# Patient Record
Sex: Male | Born: 1990 | Race: Black or African American | Hispanic: No | Marital: Single | Smoking: Never smoker
Health system: Southern US, Community
[De-identification: ages and names within clinical notes are randomized; demographics above are authoritative.]

## PROBLEM LIST (undated history)

## (undated) DIAGNOSIS — R011 Cardiac murmur, unspecified: Secondary | ICD-10-CM

## (undated) HISTORY — PX: ANKLE SURGERY: SHX546

---
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
## 2012-10-09 ENCOUNTER — Encounter (HOSPITAL_COMMUNITY): Payer: Self-pay | Admitting: *Deleted

## 2012-10-09 ENCOUNTER — Emergency Department (HOSPITAL_COMMUNITY)
Admission: EM | Admit: 2012-10-09 | Discharge: 2012-10-09 | Disposition: A | Discharge status: Home / Self Care | Payer: MEDICAID | Attending: Emergency Medicine | Admitting: Emergency Medicine

## 2012-10-09 DIAGNOSIS — F17200 Nicotine dependence, unspecified, uncomplicated: Secondary | ICD-10-CM | POA: Insufficient documentation

## 2012-10-09 DIAGNOSIS — F1210 Cannabis abuse, uncomplicated: Secondary | ICD-10-CM | POA: Insufficient documentation

## 2012-10-09 DIAGNOSIS — F1290 Cannabis use, unspecified, uncomplicated: Secondary | ICD-10-CM

## 2012-10-09 DIAGNOSIS — R0789 Other chest pain: Secondary | ICD-10-CM | POA: Insufficient documentation

## 2012-10-09 DIAGNOSIS — R011 Cardiac murmur, unspecified: Secondary | ICD-10-CM | POA: Insufficient documentation

## 2012-10-09 HISTORY — DX: Cardiac murmur, unspecified: R01.1

## 2012-10-09 NOTE — ED Notes (Signed)
Recalled patient to triage room for assessment. Patient states "I feel better. I don't feel like my heart is racing."

## 2012-10-09 NOTE — ED Provider Notes (Signed)
History     CSN: [id number]  Arrival date & time 10/09/12  1757   First MD Initiated Contact with Patient 10/09/12 1937      Chief Complaint  Patient presents with  . Tachycardia    (Consider location/radiation/quality/duration/timing/severity/associated sxs/prior treatment) HPI Comments: 22 yo male presents to ED with his mother and requests evaluation of a rapid heat beat after he smoked some marijuana.  States he is unsure if the the marijuana was "laced" with any thing.  He states the sx's had improved prior to ED arrival.  He denies chest pain , shortness of breath or palpations at this time.  He also denies headache, visual changes or dizziness.    The history is provided by the patient and a parent.    Past Medical History  Diagnosis Date  . Murmur, heart     Past Surgical History  Procedure Laterality Date  . Ankle surgery      History reviewed. No pertinent family history.  History  Substance Use Topics  . Smoking status: Current Every Day Smoker  . Smokeless tobacco: Not on file  . Alcohol Use: No      Review of Systems  Constitutional: Negative for fever, chills and fatigue.  HENT: Negative for sore throat, trouble swallowing, neck pain and neck stiffness.   Respiratory: Positive for chest tightness. Negative for cough, shortness of breath and wheezing.   Cardiovascular: Negative for chest pain and palpitations.       Rapid heart beat  Gastrointestinal: Negative for nausea, vomiting, abdominal pain and blood in stool.  Genitourinary: Negative for dysuria, hematuria and flank pain.  Musculoskeletal: Negative for myalgias, back pain and arthralgias.  Skin: Negative for rash.  Neurological: Negative for dizziness, weakness and numbness.  Hematological: Does not bruise/bleed easily.  All other systems reviewed and are negative.    Allergies  Review of patient's allergies indicates no known allergies.  Home Medications  No current outpatient  prescriptions on file.  BP 139/57  Pulse 74  Temp(Src) 98.2 F (36.8 C) (Oral)  Resp 18  Ht 6\' 4"  (1.93 m)  Wt 160 lb (72.576 kg)  BMI 19.48 kg/m2  SpO2 100%  Physical Exam  Nursing note and vitals reviewed. Constitutional: He is oriented to person, place, and time. He appears well-developed and well-nourished. No distress.  HENT:  Head: Normocephalic and atraumatic.  Mouth/Throat: Oropharynx is clear and moist.  Eyes: Conjunctivae and EOM are normal. Pupils are equal, round, and reactive to light.  Neck: Normal range of motion. Neck supple.  Cardiovascular: Normal rate, regular rhythm, normal heart sounds and intact distal pulses.   No murmur heard. Pulmonary/Chest: Effort normal and breath sounds normal. No respiratory distress. He exhibits no tenderness.  Abdominal: Soft. He exhibits no distension and no mass. There is no tenderness. There is no rebound and no guarding.  Musculoskeletal: Normal range of motion. He exhibits no tenderness.  Lymphadenopathy:    He has no cervical adenopathy.  Neurological: He is alert and oriented to person, place, and time. He exhibits normal muscle tone. Coordination normal.  Skin: Skin is warm and dry.    ED Course  Procedures (including critical care time)  Labs Reviewed - No data to display No results found.      MDM     Date: 10/09/2012  Rate: 112  Rhythm: sinus tachycardia  QRS Axis: normal  Intervals: normal  ST/T Wave abnormalities: normal  Conduction Disutrbances:none  Narrative Interpretation:   Old EKG Reviewed: none  available    EKG read by Dr. Effie Shy   On recheck of vital signs, heart rate has improved, pt denies any sx's at this time.  Appears stable for discharge. sx's likely reaction from marijuana use.  Mother agrees to f/u with his PMD  The patient appears reasonably screened and/or stabilized for discharge and I doubt any other medical condition or other [facility] requiring further screening, evaluation, or  treatment in the ED at this time prior to discharge.      Maison Agrusa L. Trisha Mangle, PA-C 10/12/12 2246

## 2012-10-09 NOTE — ED Notes (Signed)
Pt presents to er today with c/o fast heartbeat after smoking a joint. Denies any pain,

## 2012-10-13 NOTE — ED Provider Notes (Signed)
Medical screening examination/treatment/procedure(s) were performed by non-physician practitioner and as supervising physician I was immediately available for consultation/collaboration.   Teea Ducey M Arelys Glassco, DO 10/13/12 1349 

## 2012-10-18 ENCOUNTER — Encounter (HOSPITAL_COMMUNITY): Payer: Self-pay

## 2012-10-18 ENCOUNTER — Emergency Department (HOSPITAL_COMMUNITY)
Admission: EM | Admit: 2012-10-18 | Discharge: 2012-10-19 | Disposition: A | Discharge status: Home / Self Care | Payer: Medicaid Other | Attending: Emergency Medicine | Admitting: Emergency Medicine

## 2012-10-18 DIAGNOSIS — R5383 Other fatigue: Secondary | ICD-10-CM

## 2012-10-18 DIAGNOSIS — R0902 Hypoxemia: Secondary | ICD-10-CM

## 2012-10-18 DIAGNOSIS — R5381 Other malaise: Secondary | ICD-10-CM | POA: Insufficient documentation

## 2012-10-18 DIAGNOSIS — I498 Other specified cardiac arrhythmias: Secondary | ICD-10-CM | POA: Insufficient documentation

## 2012-10-18 DIAGNOSIS — F1210 Cannabis abuse, uncomplicated: Secondary | ICD-10-CM | POA: Insufficient documentation

## 2012-10-18 DIAGNOSIS — R112 Nausea with vomiting, unspecified: Secondary | ICD-10-CM | POA: Insufficient documentation

## 2012-10-18 DIAGNOSIS — R011 Cardiac murmur, unspecified: Secondary | ICD-10-CM | POA: Insufficient documentation

## 2012-10-18 DIAGNOSIS — H538 Other visual disturbances: Secondary | ICD-10-CM | POA: Insufficient documentation

## 2012-10-18 DIAGNOSIS — F17200 Nicotine dependence, unspecified, uncomplicated: Secondary | ICD-10-CM | POA: Insufficient documentation

## 2012-10-18 LAB — RAPID URINE DRUG SCREEN, HOSP PERFORMED
Barbiturates: NOT DETECTED
Cocaine: NOT DETECTED
Opiates: NOT DETECTED

## 2012-10-18 LAB — COMPREHENSIVE METABOLIC PANEL
ALT: 18 U/L (ref 0–53)
AST: 18 U/L (ref 0–37)
Albumin: 4 g/dL (ref 3.5–5.2)
Alkaline Phosphatase: 72 U/L (ref 39–117)
Calcium: 9.3 mg/dL (ref 8.4–10.5)
Creatinine, Ser: 1 mg/dL (ref 0.50–1.35)
Glucose, Bld: 101 mg/dL — ABNORMAL HIGH (ref 70–99)
Total Protein: 7.3 g/dL (ref 6.0–8.3)

## 2012-10-18 LAB — CBC WITH DIFFERENTIAL/PLATELET
Basophils Relative: 0 % (ref 0–1)
Eosinophils Absolute: 0.3 10*3/uL (ref 0.0–0.7)
Eosinophils Relative: 4 % (ref 0–5)
Monocytes Absolute: 0.5 10*3/uL (ref 0.1–1.0)
Monocytes Relative: 6 % (ref 3–12)
Neutro Abs: 3.1 10*3/uL (ref 1.7–7.7)
Neutrophils Relative %: 44 % (ref 43–77)
Platelets: 287 10*3/uL (ref 150–400)
RDW: 14.3 % (ref 11.5–15.5)
WBC: 7.2 10*3/uL (ref 4.0–10.5)

## 2012-10-18 NOTE — ED Notes (Signed)
Not feeling like himself. Feeling tired, nauseated, lightheaded per mother.

## 2012-10-18 NOTE — ED Provider Notes (Signed)
History    This chart was scribed for Ward Givens, MD by Marlyne Beards, ED Scribe. The patient was seen in room APA10/APA10. Patient's care was started at 11:15 PM.    CSN: [id number]  Arrival date & time 10/18/12  2214   First MD Initiated Contact with Patient 10/18/12 2305      Chief Complaint  Patient presents with  . Nausea  . Dizziness    (Consider location/radiation/quality/duration/timing/severity/associated sxs/prior treatment) The history is provided by the patient. No language interpreter was used.   Adam Malone is a 22 y.o. male who presents to the Emergency Department complaining of moderate constant nausea and lightheadedness onset earlier this evening around 10 PM. Pt called his mother stating that he did not feel like himself. Pt had just got done walking about 6 minutes when sx's began. Pt denies any pain associated with the sx's currently but did have an  sharp pain in the top of his  head earlier in the evening for a couple of seconds that alleviated right after. Pt states that he felt nauseated upon arrival to the ED but that has resolved. Pt reports he feels fine as of now in the ED but does notice that he has some abdominal gas build up.  Patient states it's hard to describe how he felt. He denies any pain. He states he felt tired. He did not have nausea until he arrived in emergency department. He denies vomiting or diarrhea. He denies abdominal pain. He denies any headache. He denied sore throat, rhinorrhea, or coughing. He states he did have some blurred vision earlier but not now. He states he just feels tired. He states he's never felt this way before. He denies being around anybody else who is sick.    Mother states that pt had smoked some marijuana previously with a new person resulting in making his heart race. He was seen in the ED for sx's but was discharged due to everything being normal. Pt denies fever, chills, cough, diarrhea, SOB, weakness, and any  other associated symptoms.    Pt's PCP is Dr. Milford Cage   Past Medical History  Diagnosis Date  . Murmur, heart     Past Surgical History  Procedure Laterality Date  . Ankle surgery      No family history on file.  History  Substance Use Topics  . Smoking status: Current Every Day Smoker  . Smokeless tobacco: Not on file  . Alcohol Use: No  unemployed Not in school Lives with mother    Review of Systems  Constitutional: Negative for fatigue.  HENT: Negative for congestion, sinus pressure and ear discharge.   Eyes: Negative for discharge.  Respiratory: Negative for cough.   Cardiovascular: Negative for chest pain.  Gastrointestinal: Positive for nausea and vomiting. Negative for abdominal pain and diarrhea.  Genitourinary: Negative for frequency and hematuria.  Musculoskeletal: Negative for back pain.  Skin: Negative for rash.  Neurological: Positive for light-headedness. Negative for seizures and headaches.  Psychiatric/Behavioral: Negative for hallucinations.    Allergies  Review of patient's allergies indicates no known allergies.  Home Medications   none  BP 140/70  Pulse 64  Temp(Src) 97.5 F (36.4 C) (Oral)  Resp 18  Ht 6\' 3"  (1.905 m)  Wt 170 lb (77.111 kg)  BMI 21.25 kg/m2  SpO2 100%  Vital signs normal    Orthostatic VS normal but had baseline bradycardia  Physical Exam  Nursing note and vitals reviewed. Constitutional: He is oriented  to person, place, and time. He appears well-developed and well-nourished.  Non-toxic appearance. He does not appear ill. No distress.  HENT:  Head: Normocephalic and atraumatic.  Right Ear: External ear normal.  Left Ear: External ear normal.  Nose: Nose normal. No mucosal edema or rhinorrhea.  Mouth/Throat: Oropharynx is clear and moist and mucous membranes are normal. No dental abscesses or edematous.  Eyes: Conjunctivae and EOM are normal. Pupils are equal, round, and reactive to light.  Neck: Normal range  of motion and full passive range of motion without pain. Neck supple.  Cardiovascular: Normal rate, regular rhythm and normal heart sounds.  Exam reveals no gallop and no friction rub.   No murmur heard. Pulmonary/Chest: Effort normal and breath sounds normal. No respiratory distress. He has no wheezes. He has no rhonchi. He has no rales. He exhibits no tenderness and no crepitus.  Abdominal: Soft. Normal appearance and bowel sounds are normal. He exhibits no distension. There is no tenderness. There is no rebound and no guarding.  Musculoskeletal: Normal range of motion. He exhibits no edema and no tenderness.  Moves all extremities well.   Neurological: He is alert and oriented to person, place, and time. He has normal strength. No cranial nerve deficit.  Skin: Skin is warm, dry and intact. No rash noted. No erythema. No pallor.  Psychiatric: He has a normal mood and affect. His speech is normal and behavior is normal. His mood appears not anxious.    ED Course  Procedures (including critical care time) DIAGNOSTIC STUDIES: Oxygen Saturation is 100% on room air, normal by my interpretation.    COORDINATION OF CARE: 11:20 PM Discussed ED treatment with pt and pt agrees.   PT seen on 4/1 for c/o tachycardia and had an EKG done at that time. He had HR of 112 but was otherwise normal.   Patient ambulated with pulse ox monitoring. His pulse ox dropped to 88-89% on room air with ambulation and patient complained of feeling tired. He was placed back in his bed and placed back on the monitor and his pulse ox improved to 100% quickly.  02:19 Dr Oralia Manis, hospitalist, feels can finish his evaluation as an outpatient by Cardiology for possible cardiac shunt.  Discussed with mother and patient. She states he had a heart murmer as an infant and he hasn't been told he has a heart murmer lately. He understands to not do any physical activity until he sees a cardiologist and to return if he feels worse.  Will order outpatient Echo to facilitate his evaluation by cardiology.   Results for orders placed during the hospital encounter of 10/18/12  URINE RAPID DRUG SCREEN (HOSP PERFORMED)      Result Value Range   Opiates NONE DETECTED  NONE DETECTED   Cocaine NONE DETECTED  NONE DETECTED   Benzodiazepines NONE DETECTED  NONE DETECTED   Amphetamines NONE DETECTED  NONE DETECTED   Tetrahydrocannabinol NONE DETECTED  NONE DETECTED   Barbiturates NONE DETECTED  NONE DETECTED  CBC WITH DIFFERENTIAL      Result Value Range   WBC 7.2  4.0 - 10.5 K/uL   RBC 5.19  4.22 - 5.81 MIL/uL   Hemoglobin 13.7  13.0 - 17.0 g/dL   HCT 45.4  09.8 - 11.9 %   MCV 77.6 (*) 78.0 - 100.0 fL   MCH 26.4  26.0 - 34.0 pg   MCHC 34.0  30.0 - 36.0 g/dL   RDW 14.7  82.9 - 56.2 %  Platelets 287  150 - 400 K/uL   Neutrophils Relative 44  43 - 77 %   Neutro Abs 3.1  1.7 - 7.7 K/uL   Lymphocytes Relative 46  12 - 46 %   Lymphs Abs 3.3  0.7 - 4.0 K/uL   Monocytes Relative 6  3 - 12 %   Monocytes Absolute 0.5  0.1 - 1.0 K/uL   Eosinophils Relative 4  0 - 5 %   Eosinophils Absolute 0.3  0.0 - 0.7 K/uL   Basophils Relative 0  0 - 1 %   Basophils Absolute 0.0  0.0 - 0.1 K/uL  COMPREHENSIVE METABOLIC PANEL      Result Value Range   Sodium 138  135 - 145 mEq/L   Potassium 3.5  3.5 - 5.1 mEq/L   Chloride 102  96 - 112 mEq/L   CO2 26  19 - 32 mEq/L   Glucose, Bld 101 (*) 70 - 99 mg/dL   BUN 11  6 - 23 mg/dL   Creatinine, Ser 4.54  0.50 - 1.35 mg/dL   Calcium 9.3  8.4 - 09.8 mg/dL   Total Protein 7.3  6.0 - 8.3 g/dL   Albumin 4.0  3.5 - 5.2 g/dL   AST 18  0 - 37 U/L   ALT 18  0 - 53 U/L   Alkaline Phosphatase 72  39 - 117 U/L   Total Bilirubin 0.2 (*) 0.3 - 1.2 mg/dL   GFR calc non Af Amer >90  >90 mL/min   GFR calc Af Amer >90  >90 mL/min  TROPONIN I      Result Value Range   Troponin I <0.30  <0.30 ng/mL   Laboratory interpretation all normal   Dg Chest 2 View  10/19/2012  *RADIOLOGY REPORT*  Clinical  Data: Hypoxia and fatigue; dizziness and facial numbness.  CHEST - 2 VIEW  Comparison: Chest radiograph performed 10/12/2012  Findings: The lungs are well-aerated and clear.  There is no evidence of focal opacification, pleural effusion or pneumothorax.  The heart is normal in size; the mediastinal contour is within normal limits.  No acute osseous abnormalities are seen.  IMPRESSION: No acute cardiopulmonary process seen.   Original Report Authenticated By: Tonia Ghent, M.D.    Ct Angio Chest W/cm &/or Wo Cm  10/19/2012  *RADIOLOGY REPORT*  Clinical Data: Hypoxia with ambulation.  Question PE.  CT ANGIOGRAPHY CHEST  Technique:  Multidetector CT imaging of the chest using the standard protocol during bolus administration of intravenous contrast. Multiplanar reconstructed images including MIPs were obtained and reviewed to evaluate the vascular anatomy.  Contrast: OMNIPAQUE IOHEXOL 350 MG/ML SOLN  Comparison: Two-view chest x-ray 10/19/2012.  Findings: Pulmonary arterial opacification is excellent.  No focal filling defects are evident to suggest pulmonary emboli.  The heart size is normal.  No significant mediastinal or axillary adenopathy is present.  The thoracic inlet and upper abdomen are unremarkable.  The lungs are clear.  The bone windows are unremarkable.  IMPRESSION: Negative CT of the chest.   Original Report Authenticated By: Marin Roberts, M.D.       Date: 10/19/2012  Rate: 61  Rhythm: normal sinus rhythm and sinus arrhythmia  QRS Axis: normal  Intervals: normal  ST/T Wave abnormalities: normal  Conduction Disutrbances:none  Narrative Interpretation:   Old EKG Reviewed: changes noted from 10/09/2012 HR was 112    1. Fatigue   2. Hypoxia     Plan discharge  Devoria Albe, MD, Armando Gang  MDM  I personally performed the services described in this documentation, which was scribed in my presence. The recorded information has been reviewed and considered.  Devoria Albe, MD,  FACEP        Ward Givens, MD 10/19/12 [phone]

## 2012-10-18 NOTE — ED Notes (Signed)
Patient ambulated with assist, tolerated well O2 88-89, rechecked in room 100%. Patient complain of feeling tried. Nurse notified

## 2012-10-18 NOTE — ED Notes (Signed)
Pt states he just don't feel right & tired since being seen here the first of the month. Pt denies using any medications.

## 2012-10-19 ENCOUNTER — Emergency Department (HOSPITAL_COMMUNITY): Payer: Medicaid Other

## 2012-10-19 MED ORDER — IOHEXOL 350 MG/ML SOLN
100.0000 mL | Freq: Once | INTRAVENOUS | Status: AC | PRN
  Administered 2012-10-19: 100 mL via INTRAVENOUS

## 2012-10-19 MED ORDER — SODIUM CHLORIDE 0.9 % IV SOLN
Freq: Once | INTRAVENOUS | Status: AC
  Administered 2012-10-19: 1000 mL via INTRAVENOUS

## 2012-10-19 NOTE — ED Notes (Signed)
Pt alert & oriented x4, stable gait. Patient given discharge instructions, paperwork & prescription(s). Patient  instructed to stop at the registration desk to finish any additional paperwork. Patient verbalized understanding. Pt left department w/ no further questions. 

## 2012-10-23 ENCOUNTER — Encounter: Payer: Self-pay | Admitting: Cardiology

## 2012-10-23 ENCOUNTER — Ambulatory Visit (INDEPENDENT_AMBULATORY_CARE_PROVIDER_SITE_OTHER): Payer: Medicaid Other | Admitting: Cardiology

## 2012-10-23 VITALS — BP 132/90 | HR 60 | Ht 74.0 in | Wt 164.0 lb

## 2012-10-23 DIAGNOSIS — R011 Cardiac murmur, unspecified: Secondary | ICD-10-CM

## 2012-10-23 DIAGNOSIS — F1290 Cannabis use, unspecified, uncomplicated: Secondary | ICD-10-CM

## 2012-10-23 DIAGNOSIS — R0902 Hypoxemia: Secondary | ICD-10-CM

## 2012-10-23 DIAGNOSIS — F1210 Cannabis abuse, uncomplicated: Secondary | ICD-10-CM

## 2012-10-23 DIAGNOSIS — F17200 Nicotine dependence, unspecified, uncomplicated: Secondary | ICD-10-CM

## 2012-10-23 DIAGNOSIS — Z720 Tobacco use: Secondary | ICD-10-CM

## 2012-10-23 NOTE — Assessment & Plan Note (Signed)
I discussed smoking cessation with the patient today 

## 2012-10-23 NOTE — Assessment & Plan Note (Signed)
Oxygen saturation went to 88% while walking around in the office hall today. This is similar to what was described on the ER visit. As noted above, an echocardiogram with bubble study will be obtained. If this is negative, he may need pulmonary evaluation.

## 2012-10-23 NOTE — Patient Instructions (Addendum)
Your physician recommends that you schedule a follow-up appointment in: We will determine follow up after testing  Your physician has requested that you have an echocardiogram. Echocardiography is a painless test that uses sound waves to create images of your heart. It provides your doctor with information about the size and shape of your heart and how well your heart's chambers and valves are working. This procedure takes approximately one hour. There are no restrictions for this procedure.

## 2012-10-23 NOTE — Assessment & Plan Note (Signed)
I cautioned Mr. Leonhard against marijuana use. Would also be concerned about possible unknown additives.

## 2012-10-23 NOTE — Progress Notes (Signed)
Clinical Summary Adam Malone is a 22 y.o.male referred for cardiology consultation by Dr. Lynelle Doctor following recent ER visit. Primary care physician is Dr. Milford Cage.  He is here with his mother today. I reviewed his available records. His initial assessment in the ER was related to a feeling of tachycardia after smoking marijuana. He was noted to have sinus tachycardia but no other arrhythmias at that time, no specific etiology determined. On his following ER visit he was describing a vague sense of "not feeling right" after walking. Some sense of nausea and unease. Prior to this, he reported no specific exertional limitation. He has no history of exercise-induced palpitations or syncope. No reproducible chest pain. His mother states he had a heart murmur as a very young child, however this resolved prior to age 40. No history of congenital heart disease or cyanosis.  Recent ECG from 4/11 showed sinus arrhythmia. Recent lab work shows hemoglobin 13.7, troponin I less than 0.30, potassium 3.5, BUN 11, creatinine 1.0, normal AST and ALT. UDS was positive for THC. On his most recent ER visit on 4/10, ambulatory oxygen desaturation was documented. He did have a negative CTA of the chest at that time.  No Known Allergies  No current outpatient prescriptions on file.   No current facility-administered medications for this visit.    Past Medical History  Diagnosis Date  . Murmur, heart     Past Surgical History  Procedure Laterality Date  . Ankle surgery      Family History  Problem Relation Age of Onset  . Diabetes Mellitus II Maternal Grandmother     Social History Adam Malone reports that he has never smoked. He does not have any smokeless tobacco history on file. Adam Malone reports that he does not drink alcohol.  Review of Systems No palpitations or syncope. No chest pain. No orthopnea or PND. No leg edema. Stable appetite. No history of stroke. Otherwise negative.  Physical  Examination Filed Vitals:   10/23/12 1405  BP: 132/90  Pulse: 60   Filed Weights   10/23/12 1405  Weight: 164 lb (74.39 kg)   Normally nourished appearing male in no acute distress. HEENT: Conjunctiva and lids normal, oropharynx clear. Neck: Supple, no elevated JVP or carotid bruits, no thyromegaly. Lungs: Clear to auscultation, nonlabored breathing at rest. Cardiac: Regular rate and rhythm, no S3 or significant systolic or diastolic murmur, no pericardial rub.  Abdomen: Soft, nontender, no hepatomegaly, bowel sounds present, no guarding or rebound. Extremities: No pitting edema, distal pulses 2+. No cyanosis or clubbing. Skin: Warm and dry. Musculoskeletal: No kyphosis. Neuropsychiatric: Alert and oriented x3, affect grossly appropriate.   Problem List and Plan   Undiagnosed cardiac murmurs Reported as a very young child, no significant murmur documented today, seated or standing. ECG reviewed showing sinus arrhythmia with prominent respiratory variation. In light of his recent symptoms and exertional hypoxia, an echocardiogram with bubble study will be arranged to exclude any obvious intracardiac shunt. As noted above, recent CT of the chest was negative.  Hypoxia Oxygen saturation went to 88% while walking around in the office hall today. This is similar to what was described on the ER visit. As noted above, an echocardiogram with bubble study will be obtained. If this is negative, he may need pulmonary evaluation.  Tobacco abuse I discussed smoking cessation with the patient today.  Marijuana use I cautioned Adam Malone against marijuana use. Would also be concerned about possible unknown additives.    Jonelle Sidle,  M.D., F.A.C.C.

## 2012-10-23 NOTE — Assessment & Plan Note (Signed)
Reported as a very young child, no significant murmur documented today, seated or standing. ECG reviewed showing sinus arrhythmia with prominent respiratory variation. In light of his recent symptoms and exertional hypoxia, an echocardiogram with bubble study will be arranged to exclude any obvious intracardiac shunt. As noted above, recent CT of the chest was negative.

## 2012-10-25 ENCOUNTER — Ambulatory Visit (HOSPITAL_COMMUNITY)
Admission: RE | Admit: 2012-10-25 | Discharge: 2012-10-25 | Disposition: A | Discharge status: Home / Self Care | Payer: Medicaid Other | Source: Ambulatory Visit | Attending: Cardiology | Admitting: Cardiology

## 2012-10-25 DIAGNOSIS — R0902 Hypoxemia: Secondary | ICD-10-CM

## 2012-10-25 DIAGNOSIS — R0609 Other forms of dyspnea: Secondary | ICD-10-CM | POA: Insufficient documentation

## 2012-10-25 DIAGNOSIS — R0989 Other specified symptoms and signs involving the circulatory and respiratory systems: Secondary | ICD-10-CM | POA: Insufficient documentation

## 2012-10-25 DIAGNOSIS — R011 Cardiac murmur, unspecified: Secondary | ICD-10-CM | POA: Insufficient documentation

## 2012-10-25 DIAGNOSIS — F17200 Nicotine dependence, unspecified, uncomplicated: Secondary | ICD-10-CM | POA: Insufficient documentation

## 2012-10-25 NOTE — Progress Notes (Signed)
*  PRELIMINARY RESULTS* Echocardiogram Bubble study for echo has been performed.  Conrad [place] 10/25/2012, 10:14 AM

## 2012-10-25 NOTE — Progress Notes (Signed)
*  PRELIMINARY RESULTS* Echocardiogram 2D Echocardiogram has been performed.  Adam Malone 10/25/2012, 10:14 AM

## 2012-10-29 ENCOUNTER — Telehealth: Payer: Self-pay | Admitting: Cardiology

## 2012-10-29 NOTE — Telephone Encounter (Signed)
.  left message to have patient return my call.  

## 2012-10-29 NOTE — Telephone Encounter (Signed)
PT MOTHER IS CALLING FOR ECHO RESULTS/TMJ

## 2012-10-30 ENCOUNTER — Other Ambulatory Visit: Payer: Self-pay | Admitting: *Deleted

## 2012-10-30 DIAGNOSIS — R0902 Hypoxemia: Secondary | ICD-10-CM

## 2012-10-30 NOTE — Telephone Encounter (Signed)
Spoke to pt to advise results/instructions. Pt understood.  

## 2012-10-30 NOTE — Telephone Encounter (Signed)
Pt mother called back 10/30/12 at 8:30/tmj

## 2012-12-12 ENCOUNTER — Institutional Professional Consult (permissible substitution): Payer: Medicaid Other | Admitting: Pulmonary Disease

## 2013-05-31 ENCOUNTER — Ambulatory Visit: Payer: Self-pay | Admitting: Pediatrics

## 2020-05-28 ENCOUNTER — Emergency Department (HOSPITAL_COMMUNITY)
Admission: EM | Admit: 2020-05-28 | Discharge: 2020-05-28 | Disposition: A | Discharge status: Home / Self Care | Payer: Self-pay | Attending: Emergency Medicine | Admitting: Emergency Medicine

## 2020-05-28 ENCOUNTER — Emergency Department (HOSPITAL_COMMUNITY): Payer: Self-pay

## 2020-05-28 ENCOUNTER — Other Ambulatory Visit: Payer: Self-pay

## 2020-05-28 ENCOUNTER — Encounter (HOSPITAL_COMMUNITY): Payer: Self-pay | Admitting: *Deleted

## 2020-05-28 DIAGNOSIS — J3489 Other specified disorders of nose and nasal sinuses: Secondary | ICD-10-CM | POA: Insufficient documentation

## 2020-05-28 DIAGNOSIS — R0781 Pleurodynia: Secondary | ICD-10-CM | POA: Insufficient documentation

## 2020-05-28 MED ORDER — LIDOCAINE 5 % EX PTCH
1.0000 | MEDICATED_PATCH | Freq: Once | CUTANEOUS | Status: DC
  Administered 2020-05-28: 1 via TRANSDERMAL
  Filled 2020-05-28: qty 1
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
## 2020-05-28 NOTE — Discharge Instructions (Addendum)
The x-ray today did not show any signs of rib fracture.  The CT scan of your face did not show any broken bones in your nose or face.  It did show that you have several cavities.  You can try taking Tylenol and ibuprofen for your pain.  You can try putting a warm compress on your ribs to see if that helps as well.  You can buy over-the-counter lidocaine patches to help with your pain as well.  If you continue to have pain you should follow-up with your primary care doctor.
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
## 2022-11-03 ENCOUNTER — Emergency Department (HOSPITAL_COMMUNITY): Payer: Self-pay

## 2022-11-03 ENCOUNTER — Emergency Department (HOSPITAL_COMMUNITY)
Admission: EM | Admit: 2022-11-03 | Discharge: 2022-11-03 | Discharge status: Against Medical Advice | Payer: No Typology Code available for payment source | Attending: Emergency Medicine | Admitting: Emergency Medicine

## 2022-11-03 DIAGNOSIS — R110 Nausea: Secondary | ICD-10-CM | POA: Insufficient documentation

## 2022-11-03 DIAGNOSIS — Z5321 Procedure and treatment not carried out due to patient leaving prior to being seen by health care provider: Secondary | ICD-10-CM | POA: Insufficient documentation

## 2022-11-03 DIAGNOSIS — R519 Headache, unspecified: Secondary | ICD-10-CM | POA: Insufficient documentation

## 2022-11-03 DIAGNOSIS — Z7901 Long term (current) use of anticoagulants: Secondary | ICD-10-CM | POA: Insufficient documentation

## 2022-11-03 DIAGNOSIS — Y990 Civilian activity done for income or pay: Secondary | ICD-10-CM | POA: Insufficient documentation

## 2022-11-03 DIAGNOSIS — W228XXA Striking against or struck by other objects, initial encounter: Secondary | ICD-10-CM | POA: Diagnosis not present
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
## 2022-11-04 ENCOUNTER — Emergency Department (HOSPITAL_COMMUNITY)
Admission: EM | Admit: 2022-11-04 | Discharge: 2022-11-04 | Disposition: A | Discharge status: Home / Self Care | Payer: No Typology Code available for payment source | Attending: Emergency Medicine | Admitting: Emergency Medicine

## 2022-11-04 ENCOUNTER — Emergency Department (HOSPITAL_COMMUNITY): Payer: Self-pay

## 2022-11-04 DIAGNOSIS — S060X0A Concussion without loss of consciousness, initial encounter: Secondary | ICD-10-CM | POA: Diagnosis not present

## 2022-11-04 DIAGNOSIS — W228XXA Striking against or struck by other objects, initial encounter: Secondary | ICD-10-CM | POA: Diagnosis not present

## 2022-11-04 DIAGNOSIS — S0990XA Unspecified injury of head, initial encounter: Secondary | ICD-10-CM

## 2022-11-04 DIAGNOSIS — S0003XA Contusion of scalp, initial encounter: Secondary | ICD-10-CM | POA: Insufficient documentation
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
## 2022-11-04 NOTE — Discharge Instructions (Signed)
Thank you for letting us take care of you today.  Your head CT was normal. I provided 2 primary care clinics. I recommend you establish care with a PCP for management of acute or chronic complaints. You may also go to a PCP of your own choosing.  If you continue to have headaches after your head injury, please follow up with the concussion clinic as we discussed. Their information is provided.  You may take Tylenol or ibuprofen as needed for headaches, discomfort at home. For any new or worsening symptoms such as worsening of your headaches, vision changes or loss, vomiting, loss of consciousness, new injury, or other new, concerning symptoms, please return to nearest emergency department for re-evaluation.

## 2022-11-04 NOTE — ED Notes (Signed)
Pt up in room   No changes in assessement
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
